# Patient Record
Sex: Male | Born: 2000 | Race: White | Hispanic: No | Marital: Single | Smoking: Never smoker
Health system: Southern US, Community
[De-identification: ages and names within clinical notes are randomized; demographics above are authoritative.]

---
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
## 2022-12-28 DIAGNOSIS — U071 COVID-19: Secondary | ICD-10-CM | POA: Diagnosis not present

## 2023-02-01 DIAGNOSIS — R310 Gross hematuria: Secondary | ICD-10-CM | POA: Diagnosis not present

## 2023-04-15 DIAGNOSIS — R809 Proteinuria, unspecified: Secondary | ICD-10-CM | POA: Diagnosis not present

## 2023-04-15 DIAGNOSIS — R319 Hematuria, unspecified: Secondary | ICD-10-CM | POA: Diagnosis not present

## 2023-05-13 DIAGNOSIS — R319 Hematuria, unspecified: Secondary | ICD-10-CM | POA: Diagnosis not present

## 2023-08-26 DIAGNOSIS — H5203 Hypermetropia, bilateral: Secondary | ICD-10-CM | POA: Diagnosis not present

## 2023-10-16 DIAGNOSIS — R109 Unspecified abdominal pain: Secondary | ICD-10-CM | POA: Diagnosis not present

## 2023-10-16 DIAGNOSIS — R197 Diarrhea, unspecified: Secondary | ICD-10-CM | POA: Diagnosis not present

## 2023-10-16 DIAGNOSIS — J40 Bronchitis, not specified as acute or chronic: Secondary | ICD-10-CM | POA: Diagnosis not present

## 2023-10-16 DIAGNOSIS — R059 Cough, unspecified: Secondary | ICD-10-CM | POA: Diagnosis not present

## 2023-11-28 DIAGNOSIS — J029 Acute pharyngitis, unspecified: Secondary | ICD-10-CM | POA: Diagnosis not present

## 2023-11-28 DIAGNOSIS — B349 Viral infection, unspecified: Secondary | ICD-10-CM | POA: Diagnosis not present

## 2023-11-28 DIAGNOSIS — R051 Acute cough: Secondary | ICD-10-CM | POA: Diagnosis not present

## 2024-04-27 DIAGNOSIS — R319 Hematuria, unspecified: Secondary | ICD-10-CM | POA: Diagnosis not present

## 2024-04-27 DIAGNOSIS — R809 Proteinuria, unspecified: Secondary | ICD-10-CM | POA: Diagnosis not present

## 2024-05-19 DIAGNOSIS — R809 Proteinuria, unspecified: Secondary | ICD-10-CM | POA: Diagnosis not present
